# Patient Record
Sex: Female | Born: 2005 | Race: White | Hispanic: No | Marital: Single | Smoking: Current every day smoker
Health system: Southern US, Community
[De-identification: ages and names within clinical notes are randomized; demographics above are authoritative.]

## PROBLEM LIST (undated history)

## (undated) DIAGNOSIS — J45909 Unspecified asthma, uncomplicated: Secondary | ICD-10-CM

## (undated) DIAGNOSIS — F419 Anxiety disorder, unspecified: Secondary | ICD-10-CM

## (undated) DIAGNOSIS — J302 Other seasonal allergic rhinitis: Secondary | ICD-10-CM

## (undated) HISTORY — DX: Anxiety disorder, unspecified: F41.9

## (undated) HISTORY — PX: MYRINGOTOMY: SUR874

---
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
## 2023-01-04 ENCOUNTER — Ambulatory Visit: Payer: Federal, State, Local not specified - PPO | Admitting: Physician Assistant

## 2023-01-05 DIAGNOSIS — F411 Generalized anxiety disorder: Secondary | ICD-10-CM | POA: Diagnosis not present

## 2023-01-06 ENCOUNTER — Ambulatory Visit (INDEPENDENT_AMBULATORY_CARE_PROVIDER_SITE_OTHER): Payer: Federal, State, Local not specified - PPO | Admitting: Physician Assistant

## 2023-01-06 ENCOUNTER — Encounter: Payer: Self-pay | Admitting: Physician Assistant

## 2023-01-06 VITALS — BP 117/79 | HR 108

## 2023-01-06 DIAGNOSIS — F99 Mental disorder, not otherwise specified: Secondary | ICD-10-CM

## 2023-01-06 DIAGNOSIS — F422 Mixed obsessional thoughts and acts: Secondary | ICD-10-CM | POA: Diagnosis not present

## 2023-01-06 DIAGNOSIS — F411 Generalized anxiety disorder: Secondary | ICD-10-CM | POA: Diagnosis not present

## 2023-01-06 DIAGNOSIS — F5105 Insomnia due to other mental disorder: Secondary | ICD-10-CM | POA: Diagnosis not present

## 2023-01-06 MED ORDER — LORAZEPAM 0.5 MG PO TABS: 0.2500 mg | ORAL_TABLET | Freq: Two times a day (BID) | ORAL | 0 refills | Status: DC | PRN

## 2023-01-06 NOTE — Progress Notes (Signed)
[facility]  Patient ID: Beth Lee,  MRN: [id number]  PCP: Shelba Flake, MD  Date of Evaluation: 01/06/2023 Time spent:30 minutes  Chief Complaint:  Chief Complaint   Anxiety    HISTORY/CURRENT STATUS: HPI  For 2 week med check   The Propranolol has helped the tachycardia and palpitations only a little.  No dizziness or feeling faint.  She still gets overwhelmed, having panic attacks almost daily that can last just a few minutes but usually last an hour or more.  No known trigger.  She will start to obsess about things and has a hard time letting go.  It is like a loop in her mind that will not stop.  He is having trouble relaxing to go to sleep because of it.  She has been horseback riding some since she has been home from college and that helps her mental health.  States she is not really depressed but is getting very sad because she feels so anxious all the time.  Energy and motivation are good.  ADLs and personal hygiene are normal.   Denies any changes in concentration, making decisions, or remembering things.  Appetite has not changed.  Weight is stable.  Denies suicidal or homicidal thoughts.  Patient denies increased energy with decreased need for sleep, increased talkativeness, racing thoughts, impulsivity or risky behaviors, increased spending, increased libido, grandiosity, increased irritability or anger, paranoia, or hallucinations.  Denies dizziness, syncope, seizures, numbness, tingling, tremor, tics, unsteady gait, confusion. Denies muscle or joint pain, stiffness, or dystonia.  Individual Medical History/ Review of Systems: Changes? :No     Past medications for mental health diagnoses include: Luvox is helpful but at 75 mg caused too much drowsiness, 50 mg is helpful without the side effects. Hydroxyzine was helpful but caused sedation.  Allergies: Patient has no known allergies.  Current Medications:  Current Outpatient Medications:     escitalopram (LEXAPRO) 10 MG tablet, Take 1 tablet (10 mg total) by mouth daily., Disp: 30 tablet, Rfl: 1   HAILEY 24 FE 1-20 MG-MCG(24) tablet, Take 1 tablet by mouth daily., Disp: , Rfl:    LORazepam (ATIVAN) 0.5 MG tablet, Take 0.5-1 tablets (0.25-0.5 mg total) by mouth 2 (two) times daily as needed for anxiety., Disp: 20 tablet, Rfl: 0   propranolol (INDERAL) 10 MG tablet, Take 1 tablet (10 mg total) by mouth 2 (two) times daily as needed., Disp: 60 tablet, Rfl: 1   Multiple Vitamin (MULTIVITAMIN) capsule, Take 1 capsule by mouth daily. (Patient not taking: Reported on 01/06/2023), Disp: , Rfl:    norgestimate-ethinyl estradiol (ORTHO-CYCLEN) 0.25-35 MG-MCG tablet, Take 1 tablet by mouth daily as needed. (Patient not taking: Reported on 12/27/2022), Disp: , Rfl:  Medication Side Effects: none  Family Medical/ Social History: Changes? No  MENTAL HEALTH EXAM:  Blood pressure 117/79, pulse (!) 108.There is no height or weight on file to calculate BMI.  General Appearance: Casual and Well Groomed  Eye Contact:  Good  Speech:  Clear and Coherent and Normal Rate  Volume:  Normal  Mood:  Anxious  Affect:  Anxious  Thought Process:  Goal Directed and Descriptions of Associations: Circumstantial  Orientation:  Full (Time, Place, and Person)  Thought Content: Logical   Suicidal Thoughts:  No  Homicidal Thoughts:  No  Memory:  WNL  Judgement:  Good  Insight:  Good  Psychomotor Activity:  Normal  Concentration:  Concentration: Good and Attention Span: Good  Recall:  Good  Fund of Knowledge: Good  Language: Good  Assets:  Communication Skills Desire for Improvement Financial Resources/Insurance Housing Resilience Transportation  ADL's:  Intact  Cognition: WNL  Prognosis:  Good   DIAGNOSES:    ICD-10-CM   1. Mixed obsessional thoughts and acts  F42.2     2. Generalized anxiety disorder  F41.1     3. Insomnia due to other mental disorder  F51.05    F99      Receiving  Psychotherapy: No   RECOMMENDATIONS:  PDMP reviewed.  Small amount of hydrocodone given 02/12/2022. I provided 30 minutes of face to face time during this encounter, including time spent before and after the visit in records review, medical decision making, counseling pertinent to today's visit, and charting.   Lexapro was increased on 12/23/2022 so she hasn't been on it long enough to see the effects of this dose. No change in that.   We discussed the severe anxiety. Recommend she stay on the Propranolol for physical sx of anxiety. Also discussed a BZ for rescue. This isn't something we'll continue, but she can take prn when deep-breathing techniques or other self help tools don't work. We discussed the short-term and long-term risks of benzodiazepines including sedation, increased risk of falling, dizziness, tolerance, and addictive potential.  Patient understands and accepts.   Sleep hygiene discussed.   Continue Lexapro 10 mg daily. Start Ativan 0.5 mg, 1/2-1 bid prn anxiety. Do not take and drive. No ETOH while taking this.  Continue propranolol 10 mg, 1 p.o. twice daily as needed anxiety.   Strongly recommend counseling. Return in 2-3 weeks.  Melony Overly, PA-C

## 2023-01-07 DIAGNOSIS — F419 Anxiety disorder, unspecified: Secondary | ICD-10-CM | POA: Diagnosis not present

## 2023-01-11 DIAGNOSIS — F411 Generalized anxiety disorder: Secondary | ICD-10-CM | POA: Diagnosis not present

## 2023-01-17 DIAGNOSIS — F411 Generalized anxiety disorder: Secondary | ICD-10-CM | POA: Diagnosis not present

## 2023-01-18 ENCOUNTER — Other Ambulatory Visit: Payer: Self-pay | Admitting: Physician Assistant

## 2023-01-20 DIAGNOSIS — F411 Generalized anxiety disorder: Secondary | ICD-10-CM | POA: Diagnosis not present

## 2023-01-24 ENCOUNTER — Encounter: Payer: Self-pay | Admitting: Physician Assistant

## 2023-01-24 ENCOUNTER — Ambulatory Visit (INDEPENDENT_AMBULATORY_CARE_PROVIDER_SITE_OTHER): Payer: Federal, State, Local not specified - PPO | Admitting: Physician Assistant

## 2023-01-24 DIAGNOSIS — F331 Major depressive disorder, recurrent, moderate: Secondary | ICD-10-CM | POA: Diagnosis not present

## 2023-01-24 DIAGNOSIS — F411 Generalized anxiety disorder: Secondary | ICD-10-CM | POA: Diagnosis not present

## 2023-01-24 DIAGNOSIS — H9203 Otalgia, bilateral: Secondary | ICD-10-CM | POA: Diagnosis not present

## 2023-01-24 DIAGNOSIS — F422 Mixed obsessional thoughts and acts: Secondary | ICD-10-CM | POA: Diagnosis not present

## 2023-01-24 DIAGNOSIS — H6692 Otitis media, unspecified, left ear: Secondary | ICD-10-CM | POA: Diagnosis not present

## 2023-01-24 MED ORDER — DESVENLAFAXINE SUCCINATE ER 25 MG PO TB24: 25.0000 mg | ORAL_TABLET | Freq: Every day | ORAL | 1 refills | Status: DC

## 2023-01-24 NOTE — Patient Instructions (Addendum)
On the Lexapro 10 mg, take 1/2 pill for 1 week and then stop Start Pristiq 25 mg at the same time you're weaning off the Lexapro.

## 2023-01-24 NOTE — Progress Notes (Signed)
[facility]  Patient ID: Beth Lee,  MRN: [id number]  PCP: Shelba Flake, MD  Date of Evaluation: 01/24/2023 Time spent: 24 minutes  Chief Complaint:  Chief Complaint   Anxiety; Depression; Follow-up    HISTORY/CURRENT STATUS: HPI For recheck of anxiety.   Feels a tiny bit better with the anxiety since her visit several weeks ago.  We increased the Lexapro and started low-dose Ativan as needed.  It has been helpful.  She has only taking it a couple of times.  Not having panic attacks, just still gets overwhelmed with things.  She almost feels like the depression is worse though.  Since we bumped up the dose, she feels more "down" and does not really have any energy.  She has to make herself do things, although she does it it is a struggle sometimes.  ADLs and personal hygiene are normal.  Appetite is normal and weight is stable.  She sleeps pretty well.  No suicidal or homicidal thoughts.  Patient denies increased energy with decreased need for sleep, increased talkativeness, racing thoughts, impulsivity or risky behaviors, increased spending, increased libido, grandiosity, increased irritability or anger, paranoia, or hallucinations.  Denies dizziness, syncope, seizures, numbness, tingling, tremor, tics, unsteady gait, confusion. Denies muscle or joint pain, stiffness, or dystonia.  Individual Medical History/ Review of Systems: Changes? :No     Past medications for mental health diagnoses include: Luvox is helpful but at 75 mg caused too much drowsiness, 50 mg is helpful without the side effects. Lexapro may have increased depression. Hydroxyzine was helpful but caused sedation.  Allergies: Patient has no known allergies.  Current Medications:  Current Outpatient Medications:    desvenlafaxine (PRISTIQ) 25 MG 24 hr tablet, Take 1 tablet (25 mg total) by mouth daily., Disp: 30 tablet, Rfl: 1   HAILEY 24 FE 1-20 MG-MCG(24) tablet, Take 1 tablet by mouth daily.,  Disp: , Rfl:    LORazepam (ATIVAN) 0.5 MG tablet, Take 0.5-1 tablets (0.25-0.5 mg total) by mouth 2 (two) times daily as needed for anxiety., Disp: 20 tablet, Rfl: 0   propranolol (INDERAL) 10 MG tablet, Take 1 tablet (10 mg total) by mouth 2 (two) times daily as needed., Disp: 60 tablet, Rfl: 1   Multiple Vitamin (MULTIVITAMIN) capsule, Take 1 capsule by mouth daily. (Patient not taking: Reported on 01/06/2023), Disp: , Rfl:    norgestimate-ethinyl estradiol (ORTHO-CYCLEN) 0.25-35 MG-MCG tablet, Take 1 tablet by mouth daily as needed. (Patient not taking: Reported on 12/27/2022), Disp: , Rfl:  Medication Side Effects: none  Family Medical/ Social History: Changes? No  MENTAL HEALTH EXAM:  There were no vitals taken for this visit.There is no height or weight on file to calculate BMI.  General Appearance: Casual and Well Groomed  Eye Contact:  Good  Speech:  Clear and Coherent and Normal Rate  Volume:  Normal  Mood:  Euthymic  Affect:  Congruent  Thought Process:  Goal Directed and Descriptions of Associations: Circumstantial  Orientation:  Full (Time, Place, and Person)  Thought Content: Logical   Suicidal Thoughts:  No  Homicidal Thoughts:  No  Memory:  WNL  Judgement:  Good  Insight:  Good  Psychomotor Activity:  Normal  Concentration:  Concentration: Good and Attention Span: Good  Recall:  Good  Fund of Knowledge: Good  Language: Good  Assets:  Communication Skills Desire for Improvement Financial Resources/Insurance Housing Resilience Transportation  ADL's:  Intact  Cognition: WNL  Prognosis:  Good   DIAGNOSES:    ICD-10-CM  1. Major depressive disorder, recurrent episode, moderate (HCC)  F33.1     2. Mixed obsessional thoughts and acts  F42.2     3. Generalized anxiety disorder  F41.1      Receiving Psychotherapy: No   RECOMMENDATIONS:  PDMP reviewed.  Ativan for 01/06/2023. I provided 24 minutes of face to face time during this encounter, including time spent  before and after the visit in records review, medical decision making, counseling pertinent to today's visit, and charting.   She seems to be doing a little better as far as anxiety goes but worse with the depression.  Even though she is not at the maximum dose of Lexapro, I think it is best to get off that and start an SNRI.  Benefits, risks and side effects were discussed and she accepts.  Wean off Lexapro 10 mg, one half p.o. daily for 1 week.  At the same time start Cymbalta. Start Pristiq 25 mg, 1 p.o. daily. Continue Ativan 0.5 mg, 1/2-1 bid prn anxiety. Do not take and drive. No ETOH while taking this.  Continue propranolol 10 mg, 1 p.o. twice daily as needed anxiety.   Strongly recommend counseling. Return in 4 weeks.  Melony Overly, PA-C

## 2023-01-28 DIAGNOSIS — F411 Generalized anxiety disorder: Secondary | ICD-10-CM | POA: Diagnosis not present

## 2023-02-07 DIAGNOSIS — F411 Generalized anxiety disorder: Secondary | ICD-10-CM | POA: Diagnosis not present

## 2023-02-07 DIAGNOSIS — Z8709 Personal history of other diseases of the respiratory system: Secondary | ICD-10-CM | POA: Diagnosis not present

## 2023-02-07 DIAGNOSIS — H6993 Unspecified Eustachian tube disorder, bilateral: Secondary | ICD-10-CM | POA: Diagnosis not present

## 2023-02-08 DIAGNOSIS — F411 Generalized anxiety disorder: Secondary | ICD-10-CM | POA: Diagnosis not present

## 2023-02-15 ENCOUNTER — Other Ambulatory Visit: Payer: Self-pay | Admitting: Physician Assistant

## 2023-02-16 DIAGNOSIS — F411 Generalized anxiety disorder: Secondary | ICD-10-CM | POA: Diagnosis not present

## 2023-02-17 ENCOUNTER — Encounter: Payer: Self-pay | Admitting: Physician Assistant

## 2023-02-17 ENCOUNTER — Ambulatory Visit (INDEPENDENT_AMBULATORY_CARE_PROVIDER_SITE_OTHER): Payer: Federal, State, Local not specified - PPO | Admitting: Physician Assistant

## 2023-02-17 DIAGNOSIS — F411 Generalized anxiety disorder: Secondary | ICD-10-CM

## 2023-02-17 DIAGNOSIS — F3341 Major depressive disorder, recurrent, in partial remission: Secondary | ICD-10-CM

## 2023-02-17 DIAGNOSIS — F422 Mixed obsessional thoughts and acts: Secondary | ICD-10-CM

## 2023-02-17 MED ORDER — DESVENLAFAXINE SUCCINATE ER 50 MG PO TB24: 50.0000 mg | ORAL_TABLET | Freq: Every day | ORAL | 1 refills | Status: DC

## 2023-02-17 MED ORDER — LORAZEPAM 0.5 MG PO TABS: 0.2500 mg | ORAL_TABLET | Freq: Two times a day (BID) | ORAL | 1 refills | Status: DC | PRN

## 2023-02-17 NOTE — Progress Notes (Signed)
[facility]  Patient ID: Beth Lee,  MRN: [id number]  PCP: Shelba Flake, MD  Date of Evaluation: 02/17/2023 Time spent:20 minutes  Chief Complaint:  Chief Complaint   Anxiety; Depression; Follow-up    HISTORY/CURRENT STATUS: HPI For recheck of anxiety.   Doing much better off Lexapro and on Pristiq. Feels like she could be even better though. Patient is able to enjoy things.  Energy and motivation are good.  No extreme sadness, tearfulness, or feelings of hopelessness.  Sleeps well.  ADLs and personal hygiene are normal.   Denies any changes in concentration, making decisions, or remembering things.  Appetite has not changed.  Weight is stable.  Denies laxative use, calorie restricting, or binging and purging.   Denies cutting or any form of self-harm.  Still gets anxious and takes the Ativan at times but doesn't need it too often.  It is effective when she's starting to feel panicky.  Denies suicidal or homicidal thoughts.  Patient denies increased energy with decreased need for sleep, increased talkativeness, racing thoughts, impulsivity or risky behaviors, increased spending, increased libido, grandiosity, increased irritability or anger, paranoia, or hallucinations.  Denies dizziness, syncope, seizures, numbness, tingling, tremor, tics, unsteady gait, confusion. Denies muscle or joint pain, stiffness, or dystonia.  Individual Medical History/ Review of Systems: Changes? :No     Past medications for mental health diagnoses include: Luvox is helpful but at 75 mg caused too much drowsiness, 50 mg is helpful without the side effects. Lexapro increased depression and worsened cloudy thinking.  Hydroxyzine was helpful but caused sedation.  Allergies: Patient has no known allergies.  Current Medications:  Current Outpatient Medications:    desvenlafaxine (PRISTIQ) 50 MG 24 hr tablet, Take 1 tablet (50 mg total) by mouth daily., Disp: 30 tablet, Rfl: 1   HAILEY 24 FE  1-20 MG-MCG(24) tablet, Take 1 tablet by mouth daily., Disp: , Rfl:    Multiple Vitamin (MULTIVITAMIN) capsule, Take 1 capsule by mouth daily., Disp: , Rfl:    Omega-3 Fatty Acids (OMEGA 3 PO), Take by mouth., Disp: , Rfl:    propranolol (INDERAL) 10 MG tablet, Take 1 tablet (10 mg total) by mouth 2 (two) times daily as needed., Disp: 60 tablet, Rfl: 1   LORazepam (ATIVAN) 0.5 MG tablet, Take 0.5-1 tablets (0.25-0.5 mg total) by mouth 2 (two) times daily as needed for anxiety., Disp: 30 tablet, Rfl: 1   norgestimate-ethinyl estradiol (ORTHO-CYCLEN) 0.25-35 MG-MCG tablet, Take 1 tablet by mouth daily as needed. (Patient not taking: Reported on 12/27/2022), Disp: , Rfl:  Medication Side Effects: none  Family Medical/ Social History: Changes? No  MENTAL HEALTH EXAM:  There were no vitals taken for this visit.There is no height or weight on file to calculate BMI.  General Appearance: Casual and Well Groomed  Eye Contact:  Good  Speech:  Clear and Coherent and Normal Rate  Volume:  Normal  Mood:  Euthymic  Affect:  Congruent  Thought Process:  Goal Directed and Descriptions of Associations: Circumstantial  Orientation:  Full (Time, Place, and Person)  Thought Content: Logical   Suicidal Thoughts:  No  Homicidal Thoughts:  No  Memory:  WNL  Judgement:  Good  Insight:  Good  Psychomotor Activity:  Normal  Concentration:  Concentration: Good and Attention Span: Good  Recall:  Good  Fund of Knowledge: Good  Language: Good  Assets:  Communication Skills Desire for Improvement Financial Resources/Insurance Housing Physical Health Resilience Transportation  ADL's:  Intact  Cognition: WNL  Prognosis:  Good   DIAGNOSES:    ICD-10-CM   1. Recurrent major depression in partial remission (HCC)  F33.41     2. Mixed obsessional thoughts and acts  F42.2     3. Generalized anxiety disorder  F41.1      Receiving Psychotherapy: No   RECOMMENDATIONS:  PDMP reviewed.  Ativan for  01/06/2023. I provided 20 minutes of face to face time during this encounter, including time spent before and after the visit in records review, medical decision making, counseling pertinent to today's visit, and charting.   I'm glad she's doing better but I think increasing the Pristiq would be even more effective. She agrees.   Increase Pristiq to 50 mg, 1 every day.  Continue Ativan 0.5 mg, 1/2-1 bid prn anxiety. Do not take and drive. No ETOH while taking this.  Continue propranolol 10 mg, 1 p.o. twice daily as needed anxiety.   Recommend counseling. Return in 6 weeks.  Melony Overly, PA-C

## 2023-02-19 ENCOUNTER — Other Ambulatory Visit: Payer: Self-pay | Admitting: Physician Assistant

## 2023-02-23 ENCOUNTER — Ambulatory Visit: Payer: Federal, State, Local not specified - PPO | Admitting: Physician Assistant

## 2023-02-24 DIAGNOSIS — F410 Panic disorder [episodic paroxysmal anxiety] without agoraphobia: Secondary | ICD-10-CM | POA: Diagnosis not present

## 2023-02-24 DIAGNOSIS — F411 Generalized anxiety disorder: Secondary | ICD-10-CM | POA: Diagnosis not present

## 2023-02-25 DIAGNOSIS — F411 Generalized anxiety disorder: Secondary | ICD-10-CM | POA: Diagnosis not present

## 2023-03-02 DIAGNOSIS — F411 Generalized anxiety disorder: Secondary | ICD-10-CM | POA: Diagnosis not present

## 2023-03-11 ENCOUNTER — Other Ambulatory Visit: Payer: Self-pay | Admitting: Physician Assistant

## 2023-03-14 ENCOUNTER — Telehealth: Payer: Self-pay

## 2023-03-14 NOTE — Telephone Encounter (Signed)
Patient reporting Pristiq makes the top of her head foggy, heavy, not completely there. She is asking for Buspirone or something longer acting than lorazepam for anxiety.   Patient did not mention this to me, but she went to ER at [facility] on 8/16.  Panic Attack Pt with hx of anxiety had an episode of overall unwell feeling and tingling to extremities   [facility] [address], [place], [place]

## 2023-03-14 NOTE — Telephone Encounter (Signed)
Is she taking the Hydroxyzine? How often on the Ativan? Those are more likely to cause foggy headedness than the Pristiq. And is she taking the Propranolol? If not, have her take it bid routinely, and hold the hydroxyzine and Ativan as much as possible. I prefer not to add Buspar right now, just to not make too many changes at one time.

## 2023-03-15 NOTE — Telephone Encounter (Signed)
LVM to RC 

## 2023-03-16 NOTE — Telephone Encounter (Addendum)
Hydroxyzine was discontinued in June, was noted to be ineffective. Has rarely been taking propranolol, told to take bid routinely. Usually takes Ativan once a week to every week and a half. Reports has taken twice this week with school starting back. Reporting the fogginess has been occurring since April, feeling very disconnected. Advised of recommendations.

## 2023-03-16 NOTE — Telephone Encounter (Signed)
Noted. She has an appt w/ me in 2 weeks or will call back if needed.

## 2023-04-01 ENCOUNTER — Encounter: Payer: Self-pay | Admitting: Physician Assistant

## 2023-04-01 ENCOUNTER — Telehealth (INDEPENDENT_AMBULATORY_CARE_PROVIDER_SITE_OTHER): Payer: Federal, State, Local not specified - PPO | Admitting: Physician Assistant

## 2023-04-01 DIAGNOSIS — F422 Mixed obsessional thoughts and acts: Secondary | ICD-10-CM

## 2023-04-01 DIAGNOSIS — F411 Generalized anxiety disorder: Secondary | ICD-10-CM

## 2023-04-01 DIAGNOSIS — F3341 Major depressive disorder, recurrent, in partial remission: Secondary | ICD-10-CM

## 2023-04-01 MED ORDER — PROPRANOLOL HCL 10 MG PO TABS: 10.0000 mg | ORAL_TABLET | Freq: Two times a day (BID) | ORAL | 0 refills | Status: DC

## 2023-04-01 NOTE — Progress Notes (Unsigned)
[facility]  Patient ID: Beth Lee,  MRN: [id number]  PCP: Shelba Flake, MD  Date of Evaluation: 04/01/2023 Time spent:20 minutes  Chief Complaint:  Chief Complaint   Anxiety; Follow-up   Virtual Visit via Telehealth  I connected with patient by a video enabled telemedicine application, with their informed consent, and verified patient privacy and that I am speaking with the correct person using two identifiers.  I am private, in my office and the patient is at school.  I discussed the limitations, risks, security and privacy concerns of performing an evaluation and management service by video and the availability of in person appointments. I also discussed with the patient that there may be a patient responsible charge related to this service. The patient expressed understanding and agreed to proceed.   I discussed the assessment and treatment plan with the patient. The patient was provided an opportunity to ask questions and all were answered. The patient agreed with the plan and demonstrated an understanding of the instructions.   The patient was advised to call back or seek an in-person evaluation if the symptoms worsen or if the condition fails to improve as anticipated.  I provided 20 minutes of non-face-to-face time during this encounter.  HISTORY/CURRENT STATUS: HPI For recheck of anxiety.   Doing well, not taking the Ativan for several weeks, not needed it. Started taking Inderal bid routinely though and feels like that's helping a lot. Had 1 PA a few weeks ago in class but that's the only one. Not obsessing about things as much as she did several months ago.    States she feels flat, emotionless a lot of the time. Not really sure why. Going on for 'awhile' and not sense increasing Pristiq at LOV. Doesn't think it's related to being back in school, is sophomore at [employer]. Classes started about 2 weeks ago.   Patient is able to enjoy things.  Energy and  motivation are good.  No extreme sadness, tearfulness, or feelings of hopelessness.  Sleeps ok.  ADLs and personal hygiene are normal.   Denies any changes in concentration, making decisions, or remembering things.  Appetite has not changed.  Weight is stable.  Denies laxative use, calorie restricting, or binging and purging.   Denies cutting or any form of self-harm.  Denies suicidal or homicidal thoughts.  Patient denies increased energy with decreased need for sleep, increased talkativeness, racing thoughts, impulsivity or risky behaviors, increased spending, increased libido, grandiosity, increased irritability or anger, paranoia, or hallucinations.  Denies dizziness, syncope, seizures, numbness, tingling, tremor, tics, unsteady gait, confusion. Denies muscle or joint pain, stiffness, or dystonia.  Individual Medical History/ Review of Systems: Changes? :No     Past medications for mental health diagnoses include: Luvox is helpful but at 75 mg caused too much drowsiness, 50 mg is helpful without the side effects. Lexapro increased depression and worsened cloudy thinking.  Hydroxyzine was helpful but caused sedation.  Allergies: Patient has no known allergies.  Current Medications:  Current Outpatient Medications:    desvenlafaxine (PRISTIQ) 50 MG 24 hr tablet, TAKE 1 TABLET BY MOUTH EVERY DAY, Disp: 90 tablet, Rfl: 0   HAILEY 24 FE 1-20 MG-MCG(24) tablet, Take 1 tablet by mouth daily., Disp: , Rfl:    Multiple Vitamin (MULTIVITAMIN) capsule, Take 1 capsule by mouth daily., Disp: , Rfl:    Omega-3 Fatty Acids (OMEGA 3 PO), Take by mouth., Disp: , Rfl:    LORazepam (ATIVAN) 0.5 MG tablet, Take 0.5-1 tablets (0.25-0.5  mg total) by mouth 2 (two) times daily as needed for anxiety. (Patient not taking: Reported on 04/01/2023), Disp: 30 tablet, Rfl: 1   propranolol (INDERAL) 10 MG tablet, Take 1 tablet (10 mg total) by mouth 2 (two) times daily., Disp: 180 tablet, Rfl: 0 Medication Side Effects:  none  Family Medical/ Social History: Changes? No  MENTAL HEALTH EXAM:  There were no vitals taken for this visit.There is no height or weight on file to calculate BMI.  General Appearance: Casual and Well Groomed  Eye Contact:  Good  Speech:  Clear and Coherent and Normal Rate  Volume:  Normal  Mood:  Euthymic  Affect:  Congruent  Thought Process:  Goal Directed and Descriptions of Associations: Circumstantial  Orientation:  Full (Time, Place, and Person)  Thought Content: Logical   Suicidal Thoughts:  No  Homicidal Thoughts:  No  Memory:  WNL  Judgement:  Good  Insight:  Good  Psychomotor Activity:  Normal  Concentration:  Concentration: Good and Attention Span: Good  Recall:  Good  Fund of Knowledge: Good  Language: Good  Assets:  Communication Skills Desire for Improvement Financial Resources/Insurance Housing Physical Health Transportation  ADL's:  Intact  Cognition: WNL  Prognosis:  Good   DIAGNOSES:    ICD-10-CM   1. Recurrent major depression in partial remission (HCC)  F33.41     2. Mixed obsessional thoughts and acts  F42.2     3. Generalized anxiety disorder  F41.1       Receiving Psychotherapy: No   RECOMMENDATIONS:  PDMP reviewed.  Ativan for 02/17/2023. I provided 20 minutes of non-face-to-face time during this encounter, including time spent before and after the visit in records review, medical decision making, counseling pertinent to today's visit, and charting.   It's hard to say if the Pristiq increase could be causing her to be almost 'emotionless' or not. I recommend no med changes now, but recheck soon, after she's fully back in the swing of things at school. She understands.   Continue Pristiq 50 mg, 1 every day.  Continue Ativan 0.5 mg, 1/2-1 bid prn anxiety. Do not take and drive. No ETOH while taking this.  Continue propranolol 10 mg, 1 p.o. twice daily. Strongly Recommend counseling. Return in 6 weeks.  Melony Overly, PA-C

## 2023-04-04 ENCOUNTER — Telehealth: Payer: Self-pay | Admitting: Physician Assistant

## 2023-04-04 NOTE — Telephone Encounter (Signed)
Dad called with concerns for patient. I also spoke with mom.  Dad reports that Beth Lee is sure something is wrong with her like a brain tumor or seizures. She is on WebMD all the time and dad has told her to stay off of it. He said she refuses to take Ativan for fear she will get addicted. Mom reports patient was so happy and independent in high school, was very social, not anxious. This is her second year in college and mom reports she does well academically. Mom reporting she cries all the time. Patient has had some counseling, but is not getting in person counseling at school. She is unable to verbalize why she feels like this and denies any traumatic event. Mom reports she and Beth Lee have a very close relationship, but is not able to get her to open up about current issues.   Patient was seen Friday for a telehealth visit. You strongly recommended counseling, but no medication changes.

## 2023-04-04 NOTE — Telephone Encounter (Signed)
Beth Lee's father Beth Lee called in today at 2:15pm. He wanted to send an email to T. Claybon Jabs to let her know some things that are going on. I told him the email wasn't available but [name] signed a DPR for both parents and we could have one of the clinical staff call him to get some info.

## 2023-04-05 NOTE — Telephone Encounter (Signed)
Unfortunately, she didn't tell me any of these things. Stated anxiety was better. Her only complaint was feeling 'flat' like she didn't have any emotions at all.  We talked about the fact it could be related to her being back in school so I wanted to watch that before I made any medication changes.  But it sounds like the OCD is very severe and we should either change to Zoloft (would wean off Pristiq if we did this) or she can stay on the Pristiq and we will add clomipramine.  Both are helpful for OCD and should help the obsessive thoughts.  The clomipramine is sedating, she would take it at night but some people do feel groggy the next day.  That was a problem we had with Luvox in the past and that was the reason we changed treatments in the early summer.  The Zoloft is not usually sedating.  She has been nervous about being on a medication that might make her hungrier and gain weight.  Unfortunately these can do that however she will know to be mindful of that and not give into the hunger, or eat healthy meals and snacks when she is hungry.  Please let her parents know that I would like to see her as soon as possible, telehealth is fine.  Asked the front office to call her and make an appointment, okay to use one of my urgent appointments as soon as possible. I also recommend that she go to the student health services at [facility], see a medical provider so they can examine her and hopefully give her reassurance that nothing is wrong physically.  Also recommend she see a counselor at the health service.

## 2023-04-05 NOTE — Telephone Encounter (Signed)
Asked the front office to call her and make an appointment, okay to use one of my urgent appointments as soon as possible.

## 2023-04-08 NOTE — Telephone Encounter (Signed)
Lvm to schedule appt.

## 2023-04-08 NOTE — Telephone Encounter (Signed)
Spoke with patient she is looking for new provider in [place]

## 2023-04-08 NOTE — Telephone Encounter (Signed)
Dad notified of recommendations. Admin left message to call to schedule an appt.

## 2023-04-09 DIAGNOSIS — J020 Streptococcal pharyngitis: Secondary | ICD-10-CM | POA: Diagnosis not present

## 2023-04-09 DIAGNOSIS — R519 Headache, unspecified: Secondary | ICD-10-CM | POA: Diagnosis not present

## 2023-04-21 ENCOUNTER — Other Ambulatory Visit: Payer: Self-pay | Admitting: Physician Assistant

## 2023-04-21 DIAGNOSIS — F411 Generalized anxiety disorder: Secondary | ICD-10-CM | POA: Diagnosis not present

## 2023-04-27 DIAGNOSIS — L259 Unspecified contact dermatitis, unspecified cause: Secondary | ICD-10-CM | POA: Diagnosis not present

## 2023-04-27 DIAGNOSIS — J020 Streptococcal pharyngitis: Secondary | ICD-10-CM | POA: Diagnosis not present

## 2023-05-13 ENCOUNTER — Telehealth: Payer: Federal, State, Local not specified - PPO | Admitting: Physician Assistant

## 2023-05-17 DIAGNOSIS — F411 Generalized anxiety disorder: Secondary | ICD-10-CM | POA: Diagnosis not present

## 2023-05-30 DIAGNOSIS — J029 Acute pharyngitis, unspecified: Secondary | ICD-10-CM | POA: Diagnosis not present

## 2023-05-30 DIAGNOSIS — H9201 Otalgia, right ear: Secondary | ICD-10-CM | POA: Diagnosis not present

## 2023-06-06 DIAGNOSIS — B3731 Acute candidiasis of vulva and vagina: Secondary | ICD-10-CM | POA: Diagnosis not present

## 2023-06-14 DIAGNOSIS — F411 Generalized anxiety disorder: Secondary | ICD-10-CM | POA: Diagnosis not present

## 2023-06-30 DIAGNOSIS — F411 Generalized anxiety disorder: Secondary | ICD-10-CM | POA: Diagnosis not present

## 2023-07-06 ENCOUNTER — Other Ambulatory Visit: Payer: Self-pay | Admitting: Physician Assistant

## 2023-07-06 NOTE — Telephone Encounter (Signed)
Please schedule pt an appt. Lv 09/6

## 2023-07-15 NOTE — Telephone Encounter (Signed)
Mom answered the phone.  She is on DPR. She said pt is not seeing Rosey Bath anymore.

## 2023-07-19 ENCOUNTER — Ambulatory Visit: Payer: Federal, State, Local not specified - PPO | Admitting: Family Medicine

## 2023-07-28 DIAGNOSIS — F411 Generalized anxiety disorder: Secondary | ICD-10-CM | POA: Diagnosis not present

## 2023-08-08 DIAGNOSIS — J3089 Other allergic rhinitis: Secondary | ICD-10-CM | POA: Diagnosis not present

## 2023-08-16 DIAGNOSIS — F411 Generalized anxiety disorder: Secondary | ICD-10-CM | POA: Diagnosis not present

## 2023-08-23 ENCOUNTER — Other Ambulatory Visit: Payer: Self-pay | Admitting: Physician Assistant

## 2023-08-23 NOTE — Telephone Encounter (Signed)
Please schedule pt an appt lv 09/6

## 2023-08-25 NOTE — Telephone Encounter (Signed)
Disregard, patient has provider in [place]

## 2023-09-13 DIAGNOSIS — F411 Generalized anxiety disorder: Secondary | ICD-10-CM | POA: Diagnosis not present

## 2023-09-16 ENCOUNTER — Ambulatory Visit: Payer: Federal, State, Local not specified - PPO | Admitting: Family Medicine

## 2023-10-03 DIAGNOSIS — F902 Attention-deficit hyperactivity disorder, combined type: Secondary | ICD-10-CM | POA: Diagnosis not present

## 2023-10-06 DIAGNOSIS — F411 Generalized anxiety disorder: Secondary | ICD-10-CM | POA: Diagnosis not present

## 2023-10-17 DIAGNOSIS — F902 Attention-deficit hyperactivity disorder, combined type: Secondary | ICD-10-CM | POA: Diagnosis not present

## 2023-10-26 DIAGNOSIS — S4992XA Unspecified injury of left shoulder and upper arm, initial encounter: Secondary | ICD-10-CM | POA: Diagnosis not present

## 2023-10-26 DIAGNOSIS — S43005A Unspecified dislocation of left shoulder joint, initial encounter: Secondary | ICD-10-CM | POA: Diagnosis not present

## 2023-10-26 DIAGNOSIS — S43015A Anterior dislocation of left humerus, initial encounter: Secondary | ICD-10-CM | POA: Diagnosis not present

## 2023-10-26 DIAGNOSIS — R000 Tachycardia, unspecified: Secondary | ICD-10-CM | POA: Diagnosis not present

## 2023-10-26 DIAGNOSIS — X58XXXA Exposure to other specified factors, initial encounter: Secondary | ICD-10-CM | POA: Diagnosis not present

## 2023-10-26 DIAGNOSIS — Y929 Unspecified place or not applicable: Secondary | ICD-10-CM | POA: Diagnosis not present

## 2023-10-26 DIAGNOSIS — Y93B9 Activity, other involving muscle strengthening exercises: Secondary | ICD-10-CM | POA: Diagnosis not present

## 2023-11-01 DIAGNOSIS — M25512 Pain in left shoulder: Secondary | ICD-10-CM | POA: Diagnosis not present

## 2023-11-10 DIAGNOSIS — F411 Generalized anxiety disorder: Secondary | ICD-10-CM | POA: Diagnosis not present

## 2023-11-14 DIAGNOSIS — F902 Attention-deficit hyperactivity disorder, combined type: Secondary | ICD-10-CM | POA: Diagnosis not present

## 2023-11-16 DIAGNOSIS — F902 Attention-deficit hyperactivity disorder, combined type: Secondary | ICD-10-CM | POA: Diagnosis not present

## 2023-12-02 DIAGNOSIS — F429 Obsessive-compulsive disorder, unspecified: Secondary | ICD-10-CM | POA: Diagnosis not present

## 2023-12-02 DIAGNOSIS — F4321 Adjustment disorder with depressed mood: Secondary | ICD-10-CM | POA: Diagnosis not present

## 2023-12-06 DIAGNOSIS — Z113 Encounter for screening for infections with a predominantly sexual mode of transmission: Secondary | ICD-10-CM | POA: Diagnosis not present

## 2023-12-06 DIAGNOSIS — Z01419 Encounter for gynecological examination (general) (routine) without abnormal findings: Secondary | ICD-10-CM | POA: Diagnosis not present

## 2023-12-07 DIAGNOSIS — F4321 Adjustment disorder with depressed mood: Secondary | ICD-10-CM | POA: Diagnosis not present

## 2023-12-07 DIAGNOSIS — F429 Obsessive-compulsive disorder, unspecified: Secondary | ICD-10-CM | POA: Diagnosis not present

## 2023-12-07 DIAGNOSIS — F411 Generalized anxiety disorder: Secondary | ICD-10-CM | POA: Diagnosis not present

## 2023-12-08 ENCOUNTER — Encounter: Payer: Self-pay | Admitting: Family Medicine

## 2023-12-08 ENCOUNTER — Ambulatory Visit: Payer: Federal, State, Local not specified - PPO | Admitting: Family Medicine

## 2023-12-08 ENCOUNTER — Ambulatory Visit: Payer: Self-pay | Admitting: Family Medicine

## 2023-12-08 VITALS — BP 110/50 | HR 85 | Temp 97.7°F | Ht 62.25 in | Wt 144.2 lb

## 2023-12-08 DIAGNOSIS — Z1159 Encounter for screening for other viral diseases: Secondary | ICD-10-CM

## 2023-12-08 DIAGNOSIS — R5383 Other fatigue: Secondary | ICD-10-CM | POA: Diagnosis not present

## 2023-12-08 DIAGNOSIS — F488 Other specified nonpsychotic mental disorders: Secondary | ICD-10-CM

## 2023-12-08 LAB — VITAMIN B12: Vitamin B-12: 386 pg/mL (ref 211–911)

## 2023-12-08 LAB — VITAMIN D 25 HYDROXY (VIT D DEFICIENCY, FRACTURES): VITD: 49.09 ng/mL (ref 30.00–100.00)

## 2023-12-08 NOTE — Progress Notes (Signed)
 New Patient Office Visit  Subjective   Patient ID: Beth Lee, female    DOB: 2006/02/12  Age: 18 y.o. MRN: [id number]  CC:  Chief Complaint  Patient presents with   Establish Care    HPI Beth Lee presents to establish care Patient was previously seen by her pediatrician previously.   Pt is reporting that during her senior year she started having periods of "de-realization" and started having a lot of panic attacks. States she had 1 episode while she was driving where she had "convinced herself that she was having a stroke." States that she had blood work done, thyroid  checked, also saw a neurologist and ENT doctor to rule out vestibular issues. States that she was told "everything was fine" and she was switched to prozac from pristiq  and her symptoms gradually improved. States she does not have any headaches, blurry vision, or any other neurologic symptom at this time. I spent 30 minutes today reviewing documentation in the chart from the neurologist and ENT, also counseling patient about her symptoms and the plan of care. Reviewed medications as well.   I have reviewed all aspects of the patient's medical history including social, family, and surgical history.      Current Outpatient Medications  Medication Instructions   busPIRone (BUSPAR) 10 mg, 2 times daily   FLUoxetine (PROZAC) 40 mg, Daily   HAILEY 24 FE 1-20 MG-MCG(24) tablet 1 tablet, Daily   MAGNESIUM PO 190 mg, Daily at bedtime   Multiple Vitamin (MULTIVITAMIN) capsule 1 capsule, Daily   Probiotic Product (PROBIOTIC PO) Daily    Past Medical History:  Diagnosis Date   Anxiety    Asthma    Seasonal allergies     Past Surgical History:  Procedure Laterality Date   MYRINGOTOMY     WISDOM TOOTH EXTRACTION      Family History  Problem Relation Age of Onset   Depression Mother    Anxiety disorder Mother    Depression Father    Anxiety disorder Father    Heart Problems Father        tumor in heart removed  Feb 2023   Heart Problems Maternal Grandmother    Dementia Maternal Grandmother    CVA Maternal Grandfather    Heart Problems Paternal Grandfather    Drug abuse Half-Brother    Depression Half-Sister    Anxiety disorder Half-Sister    Thyroid  disease Half-Sister     Social History   Socioeconomic History   Marital status: Single    Spouse name: Not on file   Number of children: Not on file   Years of education: Not on file   Highest education level: Not on file  Occupational History   Not on file  Tobacco Use   Smoking status: Every Day    Types: Cigarettes, E-cigarettes   Smokeless tobacco: Never  Substance and Sexual Activity   Alcohol use: Not Currently   Drug use: Never   Sexual activity: Yes    Birth control/protection: OCP  Other Topics Concern   Not on file  Social History Narrative   Sr at [employer], probably going to [employer].   Lives with both parents. Both work for [employer].    Has an older half brother and sister are out of house.   Pets-horse a dog and cat.      Legal none   Caffeine either 2 cups of coffee or 2 soft drinks   Religous-no      Social Drivers of  Health   Financial Resource Strain: Low Risk  (02/24/2023)   Received from [facility]   Overall Financial Resource Strain (CARDIA)    Difficulty of Paying Living Expenses: Not hard at all  Food Insecurity: No Food Insecurity (02/24/2023)   Received from [facility]   Hunger Vital Sign    Worried About Running Out of Food in the Last Year: Never true    Ran Out of Food in the Last Year: Never true  Transportation Needs: No Transportation Needs (02/24/2023)   Received from [facility] - Transportation    Lack of Transportation (Medical): No    Lack of Transportation (Non-Medical): No  Physical Activity: Sufficiently Active (10/23/2021)   Exercise Vital Sign    Days of Exercise per Week: 6 days    Minutes of Exercise per Session: 60 min  Stress: Stress Concern Present  (10/23/2021)   [employer] of Occupational Health - Occupational Stress Questionnaire    Feeling of Stress : Rather much  Social Connections: Unknown (02/01/2023)   [facility] from [facility]   Social Network    Social Network: Not on file  Intimate Partner Violence: Not At Risk (10/26/2023)   Received from [facility]   HITS    Over the last 12 months how often did your partner physically hurt you?: Never    Over the last 12 months how often did your partner insult you or talk down to you?: Never    Over the last 12 months how often did your partner threaten you with physical harm?: Never    Over the last 12 months how often did your partner scream or curse at you?: Never    Review of Systems  All other systems reviewed and are negative.       Objective   BP (!) 110/50   Pulse 85   Temp 97.7 F (36.5 C) (Oral)   Ht 5' 2.25" (1.581 m)   Wt 144 lb 3.2 oz (65.4 kg)   LMP 11/24/2023 (Exact Date)   SpO2 99%   BMI 26.16 kg/m   Physical Exam Vitals reviewed.  Constitutional:      Appearance: Normal appearance. She is well-groomed and normal weight.  Eyes:     Conjunctiva/sclera: Conjunctivae normal.  Neck:     Thyroid : No thyromegaly.  Cardiovascular:     Rate and Rhythm: Normal rate and regular rhythm.     Pulses: Normal pulses.     Heart sounds: S1 normal and S2 normal.  Pulmonary:     Effort: Pulmonary effort is normal.     Breath sounds: Normal breath sounds and air entry.  Abdominal:     General: Bowel sounds are normal.  Musculoskeletal:     Right lower leg: No edema.     Left lower leg: No edema.  Neurological:     Mental Status: She is alert and oriented to person, place, and time. Mental status is at baseline.     Gait: Gait is intact.  Psychiatric:        Mood and Affect: Mood and affect normal.        Speech: Speech normal.        Behavior: Behavior normal.        Judgment: Judgment normal.        Assessment & Plan:  Need for hepatitis C  screening test -     Hepatitis C antibody; Future  Other fatigue -     VITAMIN D 25 Hydroxy (  Vit-D Deficiency, Fractures); Future -     Vitamin B12; Future  Patient has some mild fatigue during the day on ROS, will check vitamin levels to evaluate. I recommended she keep a journal of her symptoms in case they return. Will see her back for hr annual physical.   Return in about 1 year (around 12/07/2024) for annual physical exam.   Aida House, MD

## 2023-12-09 LAB — HEPATITIS C ANTIBODY: Hepatitis C Ab: NONREACTIVE

## 2023-12-13 DIAGNOSIS — M25312 Other instability, left shoulder: Secondary | ICD-10-CM | POA: Diagnosis not present

## 2023-12-14 DIAGNOSIS — F4321 Adjustment disorder with depressed mood: Secondary | ICD-10-CM | POA: Diagnosis not present

## 2023-12-14 DIAGNOSIS — F429 Obsessive-compulsive disorder, unspecified: Secondary | ICD-10-CM | POA: Diagnosis not present

## 2023-12-15 ENCOUNTER — Encounter: Payer: Self-pay | Admitting: Neurology

## 2023-12-21 DIAGNOSIS — M25512 Pain in left shoulder: Secondary | ICD-10-CM | POA: Diagnosis not present

## 2023-12-22 DIAGNOSIS — F429 Obsessive-compulsive disorder, unspecified: Secondary | ICD-10-CM | POA: Diagnosis not present

## 2023-12-22 DIAGNOSIS — F4321 Adjustment disorder with depressed mood: Secondary | ICD-10-CM | POA: Diagnosis not present

## 2023-12-23 DIAGNOSIS — Z30430 Encounter for insertion of intrauterine contraceptive device: Secondary | ICD-10-CM | POA: Diagnosis not present

## 2023-12-28 DIAGNOSIS — F4321 Adjustment disorder with depressed mood: Secondary | ICD-10-CM | POA: Diagnosis not present

## 2023-12-28 DIAGNOSIS — F429 Obsessive-compulsive disorder, unspecified: Secondary | ICD-10-CM | POA: Diagnosis not present

## 2024-01-02 DIAGNOSIS — F411 Generalized anxiety disorder: Secondary | ICD-10-CM | POA: Diagnosis not present

## 2024-01-05 ENCOUNTER — Ambulatory Visit: Admitting: Family Medicine

## 2024-01-11 DIAGNOSIS — F4321 Adjustment disorder with depressed mood: Secondary | ICD-10-CM | POA: Diagnosis not present

## 2024-01-11 DIAGNOSIS — F429 Obsessive-compulsive disorder, unspecified: Secondary | ICD-10-CM | POA: Diagnosis not present

## 2024-01-20 DIAGNOSIS — F4321 Adjustment disorder with depressed mood: Secondary | ICD-10-CM | POA: Diagnosis not present

## 2024-01-20 DIAGNOSIS — F429 Obsessive-compulsive disorder, unspecified: Secondary | ICD-10-CM | POA: Diagnosis not present

## 2024-01-30 DIAGNOSIS — F411 Generalized anxiety disorder: Secondary | ICD-10-CM | POA: Diagnosis not present

## 2024-02-03 DIAGNOSIS — M25512 Pain in left shoulder: Secondary | ICD-10-CM | POA: Diagnosis not present

## 2024-02-03 DIAGNOSIS — Z30431 Encounter for routine checking of intrauterine contraceptive device: Secondary | ICD-10-CM | POA: Diagnosis not present

## 2024-02-06 ENCOUNTER — Ambulatory Visit: Admitting: Neurology

## 2024-02-06 ENCOUNTER — Encounter: Payer: Self-pay | Admitting: Neurology

## 2024-02-06 VITALS — BP 112/72 | HR 96 | Ht 62.0 in | Wt 143.0 lb

## 2024-02-06 DIAGNOSIS — F481 Depersonalization-derealization syndrome: Secondary | ICD-10-CM

## 2024-02-06 NOTE — Progress Notes (Signed)
 NEUROLOGY CONSULTATION NOTE  Beth Lee MRN: [id number] DOB: 07/25/2006  Referring provider: Dr. Heron Sharper Primary care provider: Dr. Heron Sharper  Reason for consult:  dissociation, second opinion  Dear Dr Sharper:  Thank you for your kind referral of Beth Lee for consultation of the above symptoms. Although her history is well known to you, please allow me to reiterate it for the purpose of our medical record. She is alone in the office today. Records and images were personally reviewed where available.   HISTORY OF PRESENT ILLNESS: This is a 18 year old right-handed woman with a history of anxiety, presenting for second opinion regarding dissociation. She was seen by [facility] in 02/2023. She reports symptoms started a year ago, initially she was having little moments of derealization that she describes as being in her bubble and everything was just weird. They were transient initially then progressed into panic attacks. In her Freshman year in college, she was driving home when she had a a huge panic attack and felt dizzy like she would pass out, vision tunneled. She was able to pull over, the dizziness lasted 30 seconds but the panic feeling was quite a while that her parents had to come get her. After this event, she was having daily panic attacks and the sensation of derealization on a constant basis. She has been seeing Psychiatry and started on medication, she is currently on Fluoxetine and states symptoms are better with medication but still noticeable in certain situations such as when she is driving, during anything stressful, or after working out. She is able to talk and understand during the episodes, she thinks she stares off/zones out but can respond. She is overaware of what is happening with no loss of time. Her eyes feel very strange during and after, feeling what she is seeing does not look right, eyes feel fake in a sense. Her vision has gotten blurred a  few times and things look hyper-real with brighter colors. She denies any olfactory/gustatory hallucinations, her mouth gets dry. No associated rising epigastric sensation, focal numbness/tingling/weakness, myoclonic jerks. The last episode was last Tuesday as she was driving back from [place], she immediately had the derealization feeling and a lot of panic, lasting her entire 5 hours of driving. Usually if she drives, it lasts 20 minutes and resolves once she gets out of the car, but this lasted a little longer once she got home. She has prn lorazepam  which usually helps with the symptoms, but did not take since she was driving. No nocturnal episodes.   She gets a very dull headache for 20 minutes after like someone is grabbing my head. No nausea/vomiting, she does not take any pain medication. No diplopia, dysarthria/dysphagia, bowel/bladder dysfunction. She has some neck pain, worse on the left down her shoulder. Her anxiety is much better, still there but not nearly as bad. She sees Psychiatry and a therapist. She is a Higher education careers adviser. Memory is good. Grades are fine and not affected by the episodes, one time she had a to walk out of class due to the symptoms in 04/2023. She drinks alcohol every now and then. Her grandmother had anxiety. There is no family history of seizures. She had a normal birth and early development.  There is no history of febrile convulsions, CNS infections such as meningitis/encephalitis, significant traumatic brain injury, neurosurgical procedures   PAST MEDICAL HISTORY: Past Medical History:  Diagnosis Date   Anxiety    Asthma    Seasonal  allergies     PAST SURGICAL HISTORY: Past Surgical History:  Procedure Laterality Date   MYRINGOTOMY     WISDOM TOOTH EXTRACTION      MEDICATIONS: Current Outpatient Medications on File Prior to Visit  Medication Sig Dispense Refill   busPIRone (BUSPAR) 10 MG tablet Take 10 mg by mouth 2 (two) times daily.      FLUoxetine (PROZAC) 40 MG capsule Take 40 mg by mouth daily.     levonorgestrel  (MIRENA) 20 MCG/DAY IUD 1 each by Intrauterine route once.     LORazepam  (ATIVAN ) 0.5 MG tablet Take 0.5 mg by mouth every 8 (eight) hours.     MAGNESIUM PO Take 190 mg by mouth at bedtime.     Multiple Vitamin (MULTIVITAMIN) capsule Take 1 capsule by mouth daily.     Probiotic Product (PROBIOTIC PO) Take by mouth daily.     HAILEY 24 FE 1-20 MG-MCG(24) tablet Take 1 tablet by mouth daily.     No current facility-administered medications on file prior to visit.    ALLERGIES: No Known Allergies  FAMILY HISTORY: Family History  Problem Relation Age of Onset   Depression Mother    Anxiety disorder Mother    Depression Father    Anxiety disorder Father    Heart Problems Father        tumor in heart removed Feb 2023   Heart Problems Maternal Grandmother    Dementia Maternal Grandmother    CVA Maternal Grandfather    Heart Problems Paternal Grandfather    Drug abuse Half-Brother    Depression Half-Sister    Anxiety disorder Half-Sister    Thyroid  disease Half-Sister     SOCIAL HISTORY: Social History   Socioeconomic History   Marital status: Single    Spouse name: Not on file   Number of children: Not on file   Years of education: Not on file   Highest education level: Not on file  Occupational History   Not on file  Tobacco Use   Smoking status: Every Day    Types: Cigarettes, E-cigarettes   Smokeless tobacco: Never  Vaping Use   Vaping status: Every Day   Devices: daily  Substance and Sexual Activity   Alcohol use: Yes    Comment: occ   Drug use: Never   Sexual activity: Yes    Birth control/protection: OCP  Other Topics Concern   Not on file  Social History Narrative   Sr at [employer], probably going to [employer].   Lives with both parents. Both work for [employer].    Has an older half brother and sister are out of house.   Pets-horse a dog and cat.      Legal none    Caffeine either 2 cups of coffee or 2 soft drinks   Religous-no   Right handed      Social Drivers of Health   Financial Resource Strain: Low Risk  (02/24/2023)   Received from [facility]   Overall Financial Resource Strain (CARDIA)    Difficulty of Paying Living Expenses: Not hard at all  Food Insecurity: No Food Insecurity (02/24/2023)   Received from [facility]   Hunger Vital Sign    Within the past 12 months, you worried that your food would run out before you got the money to buy more.: Never true    Within the past 12 months, the food you bought just didn't last and you didn't have money to get more.: Never true  Transportation Needs:  No Transportation Needs (02/24/2023)   Received from [facility]   PRAPARE - Transportation    Lack of Transportation (Medical): No    Lack of Transportation (Non-Medical): No  Physical Activity: Sufficiently Active (10/23/2021)   Exercise Vital Sign    Days of Exercise per Week: 6 days    Minutes of Exercise per Session: 60 min  Stress: Stress Concern Present (10/23/2021)   [employer] of Occupational Health - Occupational Stress Questionnaire    Feeling of Stress : Rather much  Social Connections: Unknown (02/01/2023)   Received from [facility]   Social Network    Social Network: Not on file  Intimate Partner Violence: Not At Risk (10/26/2023)   Received from [facility]   HITS    Over the last 12 months how often did your partner physically hurt you?: Never    Over the last 12 months how often did your partner insult you or talk down to you?: Never    Over the last 12 months how often did your partner threaten you with physical harm?: Never    Over the last 12 months how often did your partner scream or curse at you?: Never     PHYSICAL EXAM: Vitals:   02/06/24 0910  BP: 112/72  Pulse: 96   General: No acute distress Head:  Normocephalic/atraumatic Skin/Extremities: No rash, no edema Neurological Exam: Mental status:  alert and oriented to person, place, and time, no dysarthria or aphasia, Fund of knowledge is appropriate.  Recent and remote memory are intact, 2/3 delayed recall.  Attention and concentration are normal, 5/5 WORLD backwards. Cranial nerves: CN I: not tested CN II: pupils equal, round, visual fields intact CN III, IV, VI:  full range of motion, no nystagmus, no ptosis CN V: facial sensation intact CN VII: upper and lower face symmetric CN VIII: hearing intact to conversation Bulk & Tone: normal, no fasciculations. Motor: 5/5 throughout with no pronator drift. Sensation: intact to light touch, cold, pin, vibration sense.  No extinction to double simultaneous stimulation.  Romberg test negative Deep Tendon Reflexes: +1 throughout Cerebellar: no incoordination on finger to nose testing Gait: narrow-based and steady, able to tandem walk adequately. Tremor: none   IMPRESSION: This is a 18 year old right-handed woman with a history of anxiety, presenting for second opinion regarding dissociation. She describes symptoms of derealization with things around her feeling weird, as well as depersonalization (eyes feel fake in a sense). No loss of awareness during the episodes, last episode recently lasted 5 hours while she was driving. She was having them constantly for a time but reports that with her current psychiatric medications, symptoms are better and occur mostly with identifiable triggers. Her neurological exam is normal. We discussed that symptoms are most likely due to underlying psychiatric condition but we will do a 1-hour EEG to assess if there is any neurological component but this is less likely in light of semiology of symptoms. Our office will call with EEG results, if normal, follow-up as needed. Continue follow-up with Behavioral Health. She knows to call for any changes.    Thank you for allowing me to participate in the care of this patient. Please do not hesitate to call for any  questions or concerns.   Darice Shivers, M.D.  CC: Dr. Ozell

## 2024-02-06 NOTE — Patient Instructions (Signed)
 Good to meet you! Schedule 1-hour EEG. Our office will call with results, if normal, follow-up as needed. Continue follow-up with Behavioral Health.

## 2024-02-10 DIAGNOSIS — F4321 Adjustment disorder with depressed mood: Secondary | ICD-10-CM | POA: Diagnosis not present

## 2024-02-10 DIAGNOSIS — F429 Obsessive-compulsive disorder, unspecified: Secondary | ICD-10-CM | POA: Diagnosis not present

## 2024-02-23 ENCOUNTER — Ambulatory Visit: Admitting: Neurology

## 2024-02-24 ENCOUNTER — Ambulatory Visit: Admitting: Neurology

## 2024-02-24 DIAGNOSIS — F481 Depersonalization-derealization syndrome: Secondary | ICD-10-CM

## 2024-02-24 NOTE — Progress Notes (Signed)
 EEG complete and ready for review.

## 2024-02-27 DIAGNOSIS — Z113 Encounter for screening for infections with a predominantly sexual mode of transmission: Secondary | ICD-10-CM | POA: Diagnosis not present

## 2024-02-27 DIAGNOSIS — R102 Pelvic and perineal pain: Secondary | ICD-10-CM | POA: Diagnosis not present

## 2024-02-29 ENCOUNTER — Ambulatory Visit: Payer: Self-pay | Admitting: Neurology

## 2024-02-29 DIAGNOSIS — F411 Generalized anxiety disorder: Secondary | ICD-10-CM | POA: Diagnosis not present

## 2024-02-29 NOTE — Procedures (Signed)
 ELECTROENCEPHALOGRAM REPORT  Date of Study: 02/24/2024  Patient's Name: Beth Lee MRN: [id number] Date of Birth: 2006-02-17  Referring Provider: Dr. Darice Shivers  Clinical History: This is a 18 year old woman with symptoms of derealization with things around her feeling weird, as well as depersonalization (eyes feel fake in a sense). EEG for classification.  Medications: Fluoxetine, Buspar, Lorazepam   Technical Summary: A multichannel digital 1-hour EEG recording measured by the international 10-20 system with electrodes applied with paste and impedances below 5000 ohms performed in our laboratory with EKG monitoring in an awake and asleep patient.  Hyperventilation and photic stimulation were performed.  The digital EEG was referentially recorded, reformatted, and digitally filtered in a variety of bipolar and referential montages for optimal display.    Description: The patient is awake and asleep during the recording.  During maximal wakefulness, there is a symmetric, medium voltage 10 Hz posterior dominant rhythm that attenuates with eye opening.  The record is symmetric.  During drowsiness and stage I sleep, there is an increase in theta slowing of the background with vertex waves and central beta activity seen. Hyperventilation and photic stimulation did not elicit any abnormalities.  There were no epileptiform discharges or electrographic seizures seen.    EKG lead was unremarkable.  Impression: This 1-hour awake and asleep EEG is normal.    Clinical Correlation: A normal EEG does not exclude a clinical diagnosis of epilepsy.  If further clinical questions remain, prolonged EEG may be helpful.  Clinical correlation is advised.   Darice Shivers, M.D.

## 2024-03-01 NOTE — Telephone Encounter (Signed)
 Caller is returning a call to renee about her results

## 2024-03-14 DIAGNOSIS — H04123 Dry eye syndrome of bilateral lacrimal glands: Secondary | ICD-10-CM | POA: Diagnosis not present

## 2024-03-14 DIAGNOSIS — H5711 Ocular pain, right eye: Secondary | ICD-10-CM | POA: Diagnosis not present

## 2024-03-29 DIAGNOSIS — F411 Generalized anxiety disorder: Secondary | ICD-10-CM | POA: Diagnosis not present

## 2024-03-31 DIAGNOSIS — R051 Acute cough: Secondary | ICD-10-CM | POA: Diagnosis not present

## 2024-03-31 DIAGNOSIS — U071 COVID-19: Secondary | ICD-10-CM | POA: Diagnosis not present

## 2024-03-31 DIAGNOSIS — J029 Acute pharyngitis, unspecified: Secondary | ICD-10-CM | POA: Diagnosis not present

## 2024-04-13 DIAGNOSIS — H04123 Dry eye syndrome of bilateral lacrimal glands: Secondary | ICD-10-CM | POA: Diagnosis not present

## 2024-04-20 DIAGNOSIS — R1031 Right lower quadrant pain: Secondary | ICD-10-CM | POA: Diagnosis not present

## 2024-04-26 DIAGNOSIS — F411 Generalized anxiety disorder: Secondary | ICD-10-CM | POA: Diagnosis not present

## 2024-05-23 DIAGNOSIS — F411 Generalized anxiety disorder: Secondary | ICD-10-CM | POA: Diagnosis not present

## 2024-05-28 ENCOUNTER — Encounter: Payer: Self-pay | Admitting: Family Medicine

## 2024-06-08 ENCOUNTER — Encounter: Payer: Self-pay | Admitting: Family Medicine

## 2024-06-08 ENCOUNTER — Ambulatory Visit: Admitting: Family Medicine

## 2024-06-08 VITALS — BP 112/60 | HR 78 | Temp 98.2°F | Ht 62.0 in | Wt 143.4 lb

## 2024-06-08 DIAGNOSIS — Z720 Tobacco use: Secondary | ICD-10-CM | POA: Diagnosis not present

## 2024-06-08 DIAGNOSIS — Z716 Tobacco abuse counseling: Secondary | ICD-10-CM | POA: Diagnosis not present

## 2024-06-08 MED ORDER — NICOTINE 7 MG/24HR TD PT24: 7.0000 mg | MEDICATED_PATCH | Freq: Every day | TRANSDERMAL | 0 refills | Status: DC

## 2024-06-08 MED ORDER — NICOTINE 14 MG/24HR TD PT24: 14.0000 mg | MEDICATED_PATCH | Freq: Every day | TRANSDERMAL | 0 refills | Status: DC

## 2024-06-08 NOTE — Progress Notes (Signed)
 Established Patient Office Visit  Subjective   Patient ID: Beth Lee, female    DOB: 09/28/2005  Age: 18 y.o. MRN: [id number]  Chief Complaint  Patient presents with   Nicotine Dependence    Patient requests assistance to discontinue vaping    Nicotine Dependence Her urge triggers include company of smokers.   Discussed the use of AI scribe software for clinical note transcription with the patient, who gave verbal consent to proceed.  History of Present Illness   Beth Lee is a 18 year old female who presents with a desire to quit vaping.  Beth Lee has been vaping for four years, starting in high school and continuing into college. She uses small pods from gas stations, with each pod lasting about two and a half days. She frequently reaches for her vape. Her motivation to quit includes feeling it is becoming inappropriate as she ages and her upcoming shoulder surgery next month. She aims to quit by next year, ideally before the surgery. She has no issues with breathing, shortness of breath, coughing, or mucus production.       Current Outpatient Medications  Medication Instructions   busPIRone (BUSPAR) 10 mg, 2 times daily   clonazePAM (KLONOPIN) 0.5 mg   FLUoxetine (PROZAC) 40 mg, Daily   levonorgestrel  (MIRENA) 20 MCG/DAY IUD 1 each,  Once   Multiple Vitamin (MULTIVITAMIN) capsule 1 capsule, Daily   nicotine (NICODERM CQ - DOSED IN MG/24 HOURS) 14 mg, Transdermal, Daily   nicotine (NICODERM CQ - DOSED IN MG/24 HR) 7 mg, Transdermal, Daily    Patient Active Problem List   Diagnosis Date Noted   Anterior dislocation of left shoulder 12/09/2022   Hip pain, acute, left 12/01/2016   Spina bifida occulta 04/21/2015   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     Objective:     BP 112/60    Pulse 78   Temp 98.2 F (36.8 C) (Oral)   Ht 5' 2 (1.575 m)   Wt 143 lb 6.4 oz (65 kg)   LMP 05/28/2024 (Exact Date)   SpO2 99%   BMI 26.23 kg/m    Physical Exam Vitals reviewed.  Constitutional:      Appearance: Normal appearance. She is normal weight.  Cardiovascular:     Rate and Rhythm: Normal rate and regular rhythm.     Heart sounds: Normal heart sounds.  Neurological:     Mental Status: She is alert and oriented to person, place, and time. Mental status is at baseline.  Psychiatric:        Mood and Affect: Mood normal.        Behavior: Behavior normal.      No results found for any visits on 06/08/24.    The ASCVD Risk score (Arnett DK, et al., 2019) failed to calculate for the following reasons:   The 2019 ASCVD risk score is only valid for ages 1 to 46    Assessment & Plan:  Vapes nicotine containing substance -     Nicotine; Place 1 patch (14 mg total) onto the skin daily.  Dispense: 14 patch; Refill: 0 -     Nicotine; Place 1 patch (7 mg total) onto the skin daily.  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 She uses vape pods lasting about two and a half days. She aims to quit vaping by next year, especially in preparation for upcoming shoulder surgery. Discussed nicotine replacement therapy (NRT) and pharmacotherapy options. NRT, particularly nicotine patches, is preferred due to ease of use and minimal side effects. Chantix and Wellbutrin were discussed as alternatives, with Chantix having potential side effects such as abnormal dreams and increased suicidal thoughts. Wellbutrin has lower success rates compared to NRT and Chantix. - Initiated nicotine patches at 14 mg for two weeks, then instructed to taper to 7 mg for the next two weeks. - Advised on behavioral strategies to manage cravings,  including chewing gum, using flavored sticks, and engaging in physical activity. - Provided educational handout on steps to quit smoking. - Instructed to monitor progress and adjust nicotine patch dosage if necessary.  Tobacco use counseling Counseling provided on the cessation of vaping, emphasizing the importance of quitting in preparation for upcoming surgery and overall health improvement. Discussed the variability in nicotine content in vape products and the lack of regulation compared to cigarettes. Encouraged mental preparation and behavioral strategies to support cessation efforts. - Provided counseling on the importance of quitting vaping, especially in relation to upcoming surgery. - Encouraged mental preparation and behavioral strategies to support cessation efforts.        No follow-ups on file.    Heron CHRISTELLA Sharper, MD

## 2024-06-27 DIAGNOSIS — F411 Generalized anxiety disorder: Secondary | ICD-10-CM | POA: Diagnosis not present

## 2024-07-09 DIAGNOSIS — G8918 Other acute postprocedural pain: Secondary | ICD-10-CM | POA: Diagnosis not present

## 2024-07-09 DIAGNOSIS — M25312 Other instability, left shoulder: Secondary | ICD-10-CM | POA: Diagnosis not present

## 2024-07-11 DIAGNOSIS — S43002D Unspecified subluxation of left shoulder joint, subsequent encounter: Secondary | ICD-10-CM | POA: Diagnosis not present

## 2024-07-17 DIAGNOSIS — S43002D Unspecified subluxation of left shoulder joint, subsequent encounter: Secondary | ICD-10-CM | POA: Diagnosis not present

## 2024-07-25 DIAGNOSIS — D492 Neoplasm of unspecified behavior of bone, soft tissue, and skin: Secondary | ICD-10-CM | POA: Diagnosis not present

## 2024-07-25 DIAGNOSIS — L538 Other specified erythematous conditions: Secondary | ICD-10-CM | POA: Diagnosis not present

## 2024-07-25 DIAGNOSIS — B079 Viral wart, unspecified: Secondary | ICD-10-CM | POA: Diagnosis not present

## 2024-08-24 ENCOUNTER — Ambulatory Visit: Admitting: Family Medicine

## 2024-08-31 ENCOUNTER — Ambulatory Visit: Admitting: Family Medicine

## 2024-08-31 ENCOUNTER — Encounter: Payer: Self-pay | Admitting: Family Medicine

## 2024-08-31 VITALS — BP 122/78 | HR 93 | Temp 98.5°F | Ht 62.0 in | Wt 139.4 lb

## 2024-08-31 DIAGNOSIS — R635 Abnormal weight gain: Secondary | ICD-10-CM

## 2024-08-31 MED ORDER — METFORMIN HCL 500 MG PO TABS: 500.0000 mg | ORAL_TABLET | Freq: Two times a day (BID) | ORAL | 2 refills | Status: AC

## 2024-08-31 NOTE — Progress Notes (Unsigned)
" ° °  Established Patient Office Visit  Subjective   Patient ID: Beth Lee, female    DOB: 14-Apr-2006  Age: 19 y.o. MRN: [id number]  Chief Complaint  Patient presents with   Obesity    Patient requests assistance to help her lose weight    HPI   Current Outpatient Medications  Medication Instructions   busPIRone (BUSPAR) 10 mg, 2 times daily   clonazePAM (KLONOPIN) 0.5 mg   FLUoxetine (PROZAC) 40 mg, Daily   levonorgestrel  (MIRENA) 20 MCG/DAY IUD 1 each,  Once   metFORMIN  (GLUCOPHAGE ) 500 mg, Oral, 2 times daily with meals   Multiple Vitamin (MULTIVITAMIN) capsule 1 capsule, Daily    Patient Active Problem List   Diagnosis Date Noted   Anterior dislocation of left shoulder 12/09/2022   Hip pain, acute, left 12/01/2016   Spina bifida occulta 04/21/2015   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     Objective:     BP 122/78   Pulse 93   Temp 98.5 F (36.9 C) (Oral)   Ht 5' 2 (1.575 m)   Wt 139 lb 6.4 oz (63.2 kg)   SpO2 98%   BMI 25.50 kg/m  {Vitals History (Optional):23777}  Physical Exam Vitals reviewed.  Constitutional:      Appearance: Normal appearance. She is overweight.  Neurological:     Mental Status: She is alert.      No results found for any visits on 08/31/24.  {Labs (Optional):23779}  The ASCVD Risk score (Arnett DK, et al., 2019) failed to calculate for the following reasons:   The 2019 ASCVD risk score is only valid for ages 78 to 63   * - Cholesterol units were assumed    Assessment & Plan:  Drug-induced weight gain -     metFORMIN  HCl; Take 1 tablet (500 mg total) by mouth 2 (two) times daily with a meal.  Dispense: 30 tablet; Refill: 2     No follow-ups on file.    Heron CHRISTELLA Sharper, MD "

## 2024-08-31 NOTE — Patient Instructions (Signed)
 [email or web address]  [email or web address]

## 2024-11-02 ENCOUNTER — Encounter: Admitting: Family Medicine
